# Patient Record
Sex: Male | Born: 1950 | Race: White | Hispanic: No | Marital: Married
Health system: Southern US, Community
[De-identification: ages and names within clinical notes are randomized; demographics above are authoritative.]

---

## 2007-06-10 ENCOUNTER — Ambulatory Visit: Payer: Self-pay | Admitting: Internal Medicine

## 2007-06-10 DIAGNOSIS — E785 Hyperlipidemia, unspecified: Secondary | ICD-10-CM | POA: Insufficient documentation

## 2007-06-10 DIAGNOSIS — N410 Acute prostatitis: Secondary | ICD-10-CM | POA: Insufficient documentation

## 2007-06-10 DIAGNOSIS — M549 Dorsalgia, unspecified: Secondary | ICD-10-CM | POA: Insufficient documentation

## 2007-06-10 DIAGNOSIS — N509 Disorder of male genital organs, unspecified: Secondary | ICD-10-CM | POA: Insufficient documentation

## 2007-06-10 DIAGNOSIS — I10 Essential (primary) hypertension: Secondary | ICD-10-CM | POA: Insufficient documentation

## 2007-06-10 LAB — CONVERTED CEMR LAB
ALT: 50 units/L (ref 0–53)
AST: 31 units/L (ref 0–37)
Albumin: 4.1 g/dL (ref 3.5–5.2)
Alkaline Phosphatase: 50 units/L (ref 39–117)
BUN: 11 mg/dL (ref 6–23)
Basophils Absolute: 0.1 10*3/uL (ref 0.0–0.1)
Basophils Relative: 0.6 % (ref 0.0–1.0)
Bilirubin Urine: NEGATIVE
Bilirubin, Direct: 0.2 mg/dL (ref 0.0–0.3)
Blood in Urine, dipstick: NEGATIVE
CO2: 28 meq/L (ref 19–32)
Calcium: 9.5 mg/dL (ref 8.4–10.5)
Chloride: 105 meq/L (ref 96–112)
Cholesterol: 126 mg/dL (ref 0–200)
Creatinine, Ser: 1 mg/dL (ref 0.4–1.5)
Eosinophils Absolute: 0.4 10*3/uL (ref 0.0–0.6)
Eosinophils Relative: 3.7 % (ref 0.0–5.0)
GFR calc Af Amer: 101 mL/min
GFR calc non Af Amer: 83 mL/min
Glucose, Bld: 100 mg/dL — ABNORMAL HIGH (ref 70–99)
Glucose, Urine, Semiquant: NEGATIVE
HCT: 42.5 % (ref 39.0–52.0)
HDL: 29.5 mg/dL — ABNORMAL LOW (ref >39.0)
Hemoglobin: 15.1 g/dL (ref 13.0–17.0)
Ketones, urine, test strip: NEGATIVE
LDL Cholesterol: 72 mg/dL (ref 0–99)
Lymphocytes Relative: 19.4 % (ref 12.0–46.0)
MCHC: 35.6 g/dL (ref 30.0–36.0)
MCV: 95.7 fL (ref 78.0–100.0)
Monocytes Absolute: 0.8 10*3/uL — ABNORMAL HIGH (ref 0.2–0.7)
Monocytes Relative: 8.1 % (ref 3.0–11.0)
Neutro Abs: 6.8 10*3/uL (ref 1.4–7.7)
Neutrophils Relative %: 68.2 % (ref 43.0–77.0)
Nitrite: NEGATIVE
Platelets: 281 10*3/uL (ref 150–400)
Potassium: 4.2 meq/L (ref 3.5–5.1)
Protein, U semiquant: NEGATIVE
RBC: 4.45 M/uL (ref 4.22–5.81)
RDW: 11.6 % (ref 11.5–14.6)
Sodium: 141 meq/L (ref 135–145)
Specific Gravity, Urine: 1.025
Total Bilirubin: 0.7 mg/dL (ref 0.3–1.2)
Total CHOL/HDL Ratio: 4.3
Total Protein: 6.6 g/dL (ref 6.0–8.3)
Triglycerides: 121 mg/dL (ref 0–149)
Urobilinogen, UA: 0.2
VLDL: 24 mg/dL (ref 0–40)
WBC Urine, dipstick: NEGATIVE
WBC: 10.1 10*3/uL (ref 4.5–10.5)
pH: 5.5

## 2016-04-17 ENCOUNTER — Ambulatory Visit: Payer: Self-pay | Admitting: Neurology

## 2022-02-19 ENCOUNTER — Emergency Department (HOSPITAL_BASED_OUTPATIENT_CLINIC_OR_DEPARTMENT_OTHER): Payer: 59

## 2022-02-19 ENCOUNTER — Other Ambulatory Visit: Payer: Self-pay

## 2022-02-19 ENCOUNTER — Emergency Department (HOSPITAL_BASED_OUTPATIENT_CLINIC_OR_DEPARTMENT_OTHER)
Admission: EM | Admit: 2022-02-19 | Discharge: 2022-02-19 | Disposition: A | Discharge status: Home / Self Care | Payer: 59 | Attending: Emergency Medicine | Admitting: Emergency Medicine

## 2022-02-19 DIAGNOSIS — R112 Nausea with vomiting, unspecified: Secondary | ICD-10-CM

## 2022-02-19 DIAGNOSIS — I10 Essential (primary) hypertension: Secondary | ICD-10-CM | POA: Diagnosis not present

## 2022-02-19 DIAGNOSIS — E860 Dehydration: Secondary | ICD-10-CM | POA: Insufficient documentation

## 2022-02-19 DIAGNOSIS — M791 Myalgia, unspecified site: Secondary | ICD-10-CM | POA: Insufficient documentation

## 2022-02-19 DIAGNOSIS — R6883 Chills (without fever): Secondary | ICD-10-CM | POA: Insufficient documentation

## 2022-02-19 LAB — URINALYSIS, ROUTINE W REFLEX MICROSCOPIC
Bilirubin Urine: NEGATIVE
Glucose, UA: NEGATIVE mg/dL
Hgb urine dipstick: NEGATIVE
Ketones, ur: NEGATIVE mg/dL
Leukocytes,Ua: NEGATIVE
Nitrite: NEGATIVE
Protein, ur: NEGATIVE mg/dL
Specific Gravity, Urine: 1.01 (ref 1.005–1.030)
pH: 6 (ref 5.0–8.0)

## 2022-02-19 LAB — BASIC METABOLIC PANEL
Anion gap: 8 (ref 5–15)
BUN: 13 mg/dL (ref 8–23)
CO2: 28 mmol/L (ref 22–32)
Calcium: 8.7 mg/dL — ABNORMAL LOW (ref 8.9–10.3)
Chloride: 100 mmol/L (ref 98–111)
Creatinine, Ser: 1.03 mg/dL (ref 0.61–1.24)
GFR, Estimated: >60 mL/min (ref >60)
Glucose, Bld: 113 mg/dL — ABNORMAL HIGH (ref 70–99)
Potassium: 4.4 mmol/L (ref 3.5–5.1)
Sodium: 136 mmol/L (ref 135–145)

## 2022-02-19 LAB — CBC
HCT: 46.1 % (ref 39.0–52.0)
Hemoglobin: 16.1 g/dL (ref 13.0–17.0)
MCH: 31.3 pg (ref 26.0–34.0)
MCHC: 34.9 g/dL (ref 30.0–36.0)
MCV: 89.5 fL (ref 80.0–100.0)
Platelets: 317 10*3/uL (ref 150–400)
RBC: 5.15 MIL/uL (ref 4.22–5.81)
RDW: 13.5 % (ref 11.5–15.5)
WBC: 11.2 10*3/uL — ABNORMAL HIGH (ref 4.0–10.5)
nRBC: 0 % (ref 0.0–0.2)

## 2022-02-19 LAB — HEPATIC FUNCTION PANEL
ALT: 55 U/L — ABNORMAL HIGH (ref 0–44)
AST: 29 U/L (ref 15–41)
Albumin: 3.5 g/dL (ref 3.5–5.0)
Alkaline Phosphatase: 42 U/L (ref 38–126)
Bilirubin, Direct: 0.2 mg/dL (ref 0.0–0.2)
Indirect Bilirubin: 0.5 mg/dL (ref 0.3–0.9)
Total Bilirubin: 0.7 mg/dL (ref 0.3–1.2)
Total Protein: 6.6 g/dL (ref 6.5–8.1)

## 2022-02-19 LAB — LIPASE, BLOOD: Lipase: 36 U/L (ref 11–51)

## 2022-02-19 LAB — CBG MONITORING, ED: Glucose-Capillary: 117 mg/dL — ABNORMAL HIGH (ref 70–99)

## 2022-02-19 LAB — LACTIC ACID, PLASMA: Lactic Acid, Venous: 0.9 mmol/L (ref 0.5–1.9)

## 2022-02-19 MED ORDER — IOHEXOL 300 MG/ML  SOLN
125.0000 mL | Freq: Once | INTRAMUSCULAR | Status: AC | PRN
  Administered 2022-02-19: 125 mL via INTRAVENOUS

## 2022-02-19 MED ORDER — PROMETHAZINE HCL 25 MG PO TABS: 25.0000 mg | ORAL_TABLET | Freq: Three times a day (TID) | ORAL | 0 refills | Status: AC | PRN

## 2022-02-19 MED ORDER — ONDANSETRON HCL 4 MG/2ML IJ SOLN
4.0000 mg | Freq: Once | INTRAMUSCULAR | Status: AC
  Administered 2022-02-19: 4 mg via INTRAVENOUS
  Filled 2022-02-19: qty 2

## 2022-02-19 MED ORDER — SODIUM CHLORIDE 0.9 % IV BOLUS
1000.0000 mL | Freq: Once | INTRAVENOUS | Status: AC
  Administered 2022-02-19: 1000 mL via INTRAVENOUS

## 2022-02-19 NOTE — ED Triage Notes (Signed)
C/O nausea, vomiting and fatigue x 1 week; reported was seen at [facility] and was prescribed some zofran but symptoms are persistent;

## 2022-02-19 NOTE — ED Provider Notes (Signed)
[facility] EMERGENCY DEPARTMENT Provider Note   CSN: [id number] Arrival date & time: 02/19/22  1518     History  Chief Complaint  Patient presents with   Nausea   Fatigue    Jermaine Baker is a 71 y.o. male.  The history is provided by the patient and medical records. No language interpreter was used.  Emesis Severity:  Moderate Duration:  1 week Timing:  Intermittent Quality:  Stomach contents Progression:  Unchanged Chronicity:  New Recent urination:  Decreased Relieved by:  Nothing Worsened by:  Nothing Ineffective treatments:  None tried Associated symptoms: abdominal pain, chills and myalgias   Associated symptoms: no cough, no diarrhea, no fever, no headaches and no URI   Abdominal pain:    Location:  Generalized   Quality: aching and cramping     Severity:  Severe   Onset quality:  Gradual   Duration:  1 week   Timing:  Intermittent   Progression:  Waxing and waning   Chronicity:  New Risk factors: prior abdominal surgery        Home Medications Prior to Admission medications   Not on File      Allergies    Propoxyphene n-acetaminophen and Rifampin    Review of Systems   Review of Systems  Constitutional:  Positive for chills and fatigue. Negative for diaphoresis and fever.  HENT:  Negative for congestion.   Respiratory:  Negative for cough, chest tightness, shortness of breath and wheezing.   Cardiovascular:  Negative for chest pain, palpitations and leg swelling.  Gastrointestinal:  Positive for abdominal pain, constipation, nausea and vomiting. Negative for abdominal distention, anal bleeding and diarrhea.  Genitourinary:  Positive for decreased urine volume. Negative for dysuria, flank pain and frequency.  Musculoskeletal:  Positive for myalgias. Negative for back pain, neck pain and neck stiffness.  Skin:  Negative for rash and wound.  Neurological:  Negative for dizziness, weakness, light-headedness, numbness and headaches.   Psychiatric/Behavioral:  Negative for agitation.   All other systems reviewed and are negative.   Physical Exam Updated Vital Signs BP (!) 144/78   Pulse 69   Temp 99.2 F (37.3 C) (Oral)   Resp 16   Ht 6' (1.829 m)   Wt 122.5 kg   SpO2 93%   BMI 36.62 kg/m  Physical Exam Vitals and nursing note reviewed.  Constitutional:      General: He is not in acute distress.    Appearance: He is well-developed. He is not ill-appearing, toxic-appearing or diaphoretic.  HENT:     Head: Normocephalic and atraumatic.     Mouth/Throat:     Mouth: Mucous membranes are dry.  Eyes:     Extraocular Movements: Extraocular movements intact.     Conjunctiva/sclera: Conjunctivae normal.     Pupils: Pupils are equal, round, and reactive to light.  Cardiovascular:     Rate and Rhythm: Normal rate and regular rhythm.     Heart sounds: No murmur heard. Pulmonary:     Effort: Pulmonary effort is normal. No respiratory distress.     Breath sounds: Normal breath sounds. No wheezing, rhonchi or rales.  Chest:     Chest wall: No tenderness.  Abdominal:     General: Abdomen is flat.     Palpations: Abdomen is soft.     Tenderness: There is abdominal tenderness. There is no right CVA tenderness, left CVA tenderness, guarding or rebound.  Musculoskeletal:        General: No swelling or  tenderness.     Cervical back: Neck supple.     Right lower leg: No edema.     Left lower leg: No edema.  Skin:    General: Skin is warm and dry.     Capillary Refill: Capillary refill takes less than 2 seconds.     Findings: No erythema.  Neurological:     General: No focal deficit present.     Mental Status: He is alert.     Sensory: No sensory deficit.     Motor: No weakness.  Psychiatric:        Mood and Affect: Mood normal.     ED Results / Procedures / Treatments   Labs (all labs ordered are listed, but only abnormal results are displayed) Labs Reviewed  BASIC METABOLIC PANEL - Abnormal; Notable for  the following components:      Result Value   Glucose, Bld 113 (*)    Calcium 8.7 (*)    All other components within normal limits  CBC - Abnormal; Notable for the following components:   WBC 11.2 (*)    All other components within normal limits  HEPATIC FUNCTION PANEL - Abnormal; Notable for the following components:   ALT 55 (*)    All other components within normal limits  CBG MONITORING, ED - Abnormal; Notable for the following components:   Glucose-Capillary 117 (*)    All other components within normal limits  URINE CULTURE  URINALYSIS, ROUTINE W REFLEX MICROSCOPIC  LIPASE, BLOOD  LACTIC ACID, PLASMA    EKG EKG Interpretation  Date/Time:  Thursday February 19 2022 15:35:30 EDT Ventricular Rate:  94 PR Interval:    QRS Duration: 88 QT Interval:  370 QTC Calculation: 462 R Axis:   -7 Text Interpretation: Atrial flutter with variable A-V block Possible Anterior infarct , age undetermined Abnormal ECG No previous ECGs available no prior ecg for comparison. NO STEMI Confirmed by Theda Belfast ([id number]) on 02/19/2022 7:05:13 PM  Radiology CT ABDOMEN PELVIS W CONTRAST  Result Date: 02/19/2022 CLINICAL DATA:  Abdominal pain, nausea, vomiting, prior abdominal surgery, question bowel obstruction EXAM: CT ABDOMEN AND PELVIS WITH CONTRAST TECHNIQUE: Multidetector CT imaging of the abdomen and pelvis was performed using the standard protocol following bolus administration of intravenous contrast. RADIATION DOSE REDUCTION: This exam was performed according to the departmental dose-optimization program which includes automated exposure control, adjustment of the mA and/or kV according to patient size and/or use of iterative reconstruction technique. CONTRAST:  OMNIPAQUE IOHEXOL 300 MG/ML  SOLN COMPARISON:  None available FINDINGS: Lower chest: Minimal bibasilar atelectasis Hepatobiliary: Mildly send is gallbladder. Gallbladder and liver otherwise normal appearance. No biliary dilatation.  Pancreas: Normal appearance Spleen: Normal appearance Adrenals/Urinary Tract: Adrenal glands and RIGHT kidney normal appearance. LEFT renal cyst 2.4 x 2.2 cm containing dependent milk of calcium. Large cyst at upper pole LEFT kidney 7.5 x 6.5 cm. No additional renal mass or urinary tract calcification. Ureters and bladder unremarkable. Stomach/Bowel: Normal appendix. Diverticulosis of distal half of colon without evidence of diverticulitis. Proximal colon normal appearance. Stomach and small bowel loops normal appearance. No evidence of bowel obstruction or wall thickening. Vascular/Lymphatic: Atherosclerotic calcifications aorta and iliac arteries without aneurysm. No adenopathy. Scattered normal sized retroperitoneal and mesenteric lymph nodes. Reproductive: Mild prostatic enlargement. Seminal vesicles unremarkable. Other: Small BILATERAL inguinal hernias containing fat. No free air or free fluid. No hernia or inflammatory process. Musculoskeletal: Osseous structures unremarkable. IMPRESSION: Distal colonic diverticulosis without evidence of diverticulitis. Small BILATERAL inguinal hernias containing  fat. No acute intra-abdominal or intrapelvic abnormalities. Mild prostatic enlargement. Aortic Atherosclerosis (ICD10-I70.0). Electronically Signed   By: Ulyses Southward M.D.   On: 02/19/2022 19:46    Procedures Procedures    Medications Ordered in ED Medications  sodium chloride 0.9 % bolus 1,000 mL (0 mLs Intravenous Stopped 02/19/22 2025)  ondansetron (ZOFRAN) injection 4 mg (4 mg Intravenous Given 02/19/22 1827)  iohexol (OMNIPAQUE) 300 MG/ML solution 125 mL (125 mLs Intravenous Contrast Given 02/19/22 1917)     ED Course/ Medical Decision Making/ A&P                           Medical Decision Making Amount and/or Complexity of Data Reviewed Labs: ordered. Radiology: ordered.  Risk Prescription drug management.    Jermaine Baker is a 71 y.o. male with a past medical history significant for  hypertension, hyperlipidemia, and previous abdominal hernia surgery in the past who presents with nausea, vomiting, fatigue, chills, and abdominal discomfort.  According to patient over the last week has had waxing waning symptoms of abdominal cramping/discomfort and nausea and vomiting.  He reports that he was given a prescription for Zofran that is intermittently helped however he still has the abdominal discomfort, and going.  He reports he has continued to pass some gas but is not having a bowel movement in the last 5 days.  This is atypical for him.  He reports his urine has been decreased.  He thinks he is getting dehydrated.  He denies any documented fevers at home but has had some chills and temp was 99 on arrival.  He reports no chest pain, palpitations, shortness of breath, or cough.  Denies any trauma.  Does not report any other complaints such as rashes or medication changes.  On exam, abdomen is tender in the central abdomen.  Bowel sounds were appreciated.  Flanks and back nontender.  He denies any groin symptoms at this time.  Legs nontender and nonedematous.  Mucous membranes are very dry on exam.  Lungs are clear and chest is nontender.  No murmur.  Clinically I do feel need to rule out a bowel obstruction given the patient's abdominal discomfort up to 9 out of 10 in severity at times, nausea, vomiting, and no bowel movement in 5 days with history of previous abdominal surgery.  We will get CT and get labs.  We will check urinalysis given his decreased urination.  We will give him some fluids.  Clinically I also suspect he is probably dehydrated from either a gastroenteritis that worsened his constipation and abdominal discomfort.  Anticipate reassessment after work-up to determine disposition.  We will give some fluids for his dry mouth and will also give some more IV nausea medicine.  Patient felt much better after fluids and nausea medicine.  Work-up overall reassuring.  CT scan showed  diverticulosis but no diverticulitis.  No critical abnormality seen.  Suspect likely a viral gastroenteritis and we will give him prescription for different nausea medicine to try for Zofran at home does not work.  He agrees with PCP follow-up and had no other questions or concerns.  Patient discharged in good condition.         Final Clinical Impression(s) / ED Diagnoses Final diagnoses:  Dehydration  Nausea and vomiting, unspecified vomiting type    Rx / DC Orders ED Discharge Orders          Ordered    promethazine (PHENERGAN) 25 MG tablet  Every 8 hours PRN        02/19/22 2213            Clinical Impression: 1. Dehydration   2. Nausea and vomiting, unspecified vomiting type     Disposition: Discharge  Condition: Good  I have discussed the results, Dx and Tx plan with the pt(& family if present). He/she/they expressed understanding and agree(s) with the plan. Discharge instructions discussed at great length. Strict return precautions discussed and pt &/or family have verbalized understanding of the instructions. No further questions at time of discharge.    New Prescriptions   PROMETHAZINE (PHENERGAN) 25 MG TABLET    Take 1 tablet (25 mg total) by mouth every 8 (eight) hours as needed for nausea or vomiting.    Follow Up: Ranae Pila, FNP [address] [place] [address] [phone]         Malene Blaydes, Canary Brim, MD 02/19/22 2214

## 2022-02-19 NOTE — Discharge Instructions (Signed)
Your history, exam and work-up today are consistent with likely dehydration related to a viral gastroenteritis.  The CT scan did not show any acute abnormalities and your labs were overall reassuring.  We feel you are safe for discharge home.  Please rest and stay hydrated and follow-up with your primary team.  If any symptoms change or worsen acutely, please return to the nearest emergency department.

## 2022-02-21 LAB — URINE CULTURE: Culture: NO GROWTH

## 2023-07-16 IMAGING — CT CT ABD-PELV W/ CM
2 of 5 series · 16 of 46 positions shown, 18 images · IV contrast (Omnipaque)
Comparison: None available

CLINICAL DATA: Abdominal pain, nausea, vomiting, prior abdominal
surgery, question bowel obstruction

EXAM:
CT ABDOMEN AND PELVIS WITH CONTRAST
TECHNIQUE: Multidetector CT imaging of the abdomen and pelvis was performed
using the standard protocol following bolus administration of
intravenous contrast.

[Series 2: axial st · axial · 0.98mm/px · z∈[+674,+1139]mm · 13 of 105 slices shown, 15 images]
[im 6/105  soft-tissue]
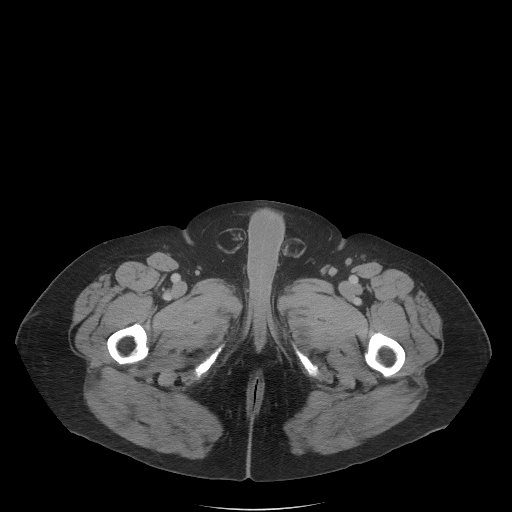
[im 6/105  bone]
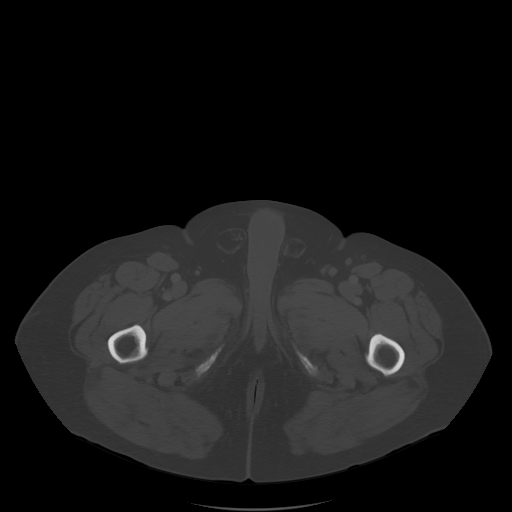
[im 17/105  soft-tissue]
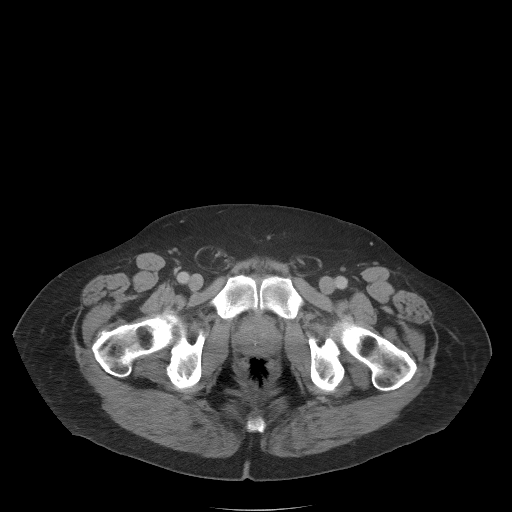
[im 22/105  soft-tissue]
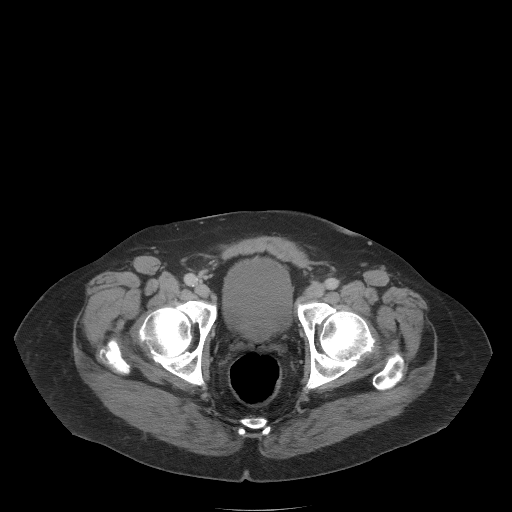
[im 28/105  soft-tissue]
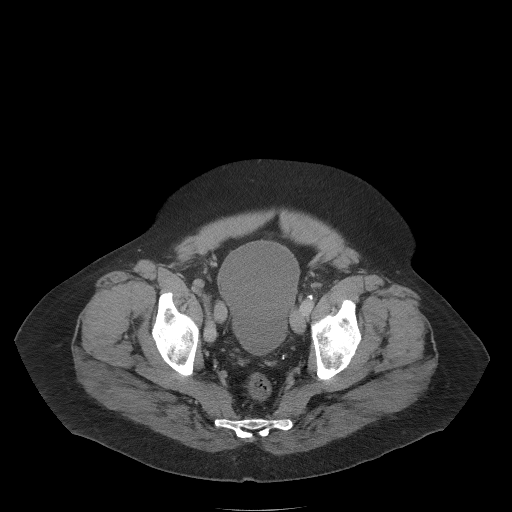
[im 39/105  soft-tissue]
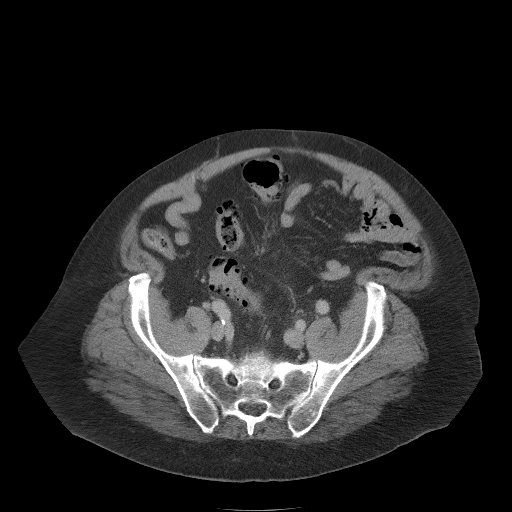
[im 44/105  soft-tissue]
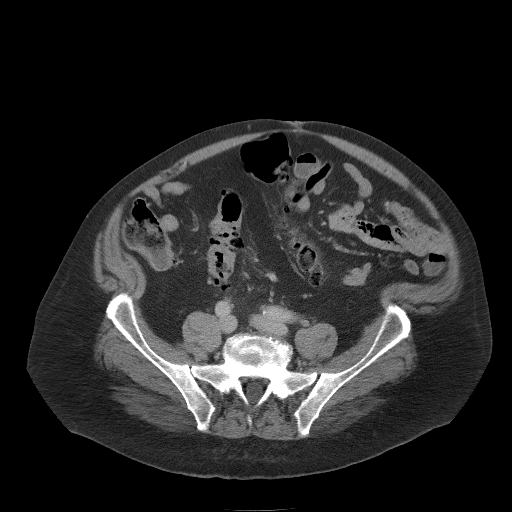
[im 55/105  soft-tissue]
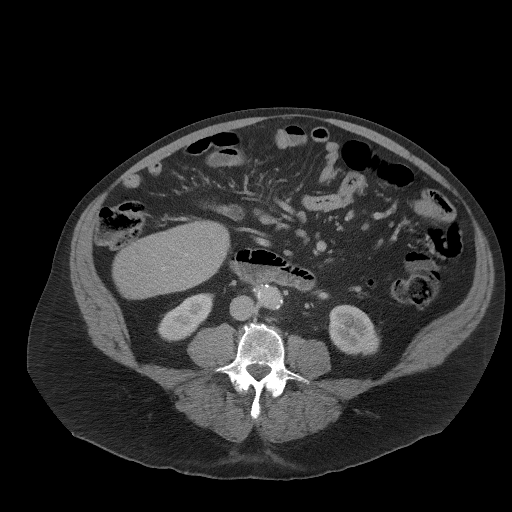
[im 61/105  soft-tissue]
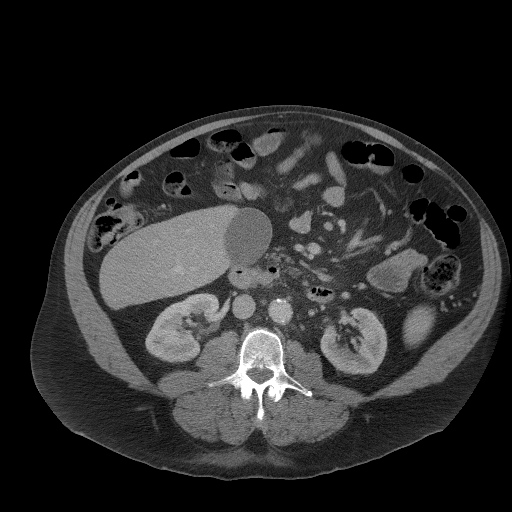
[im 66/105  soft-tissue]
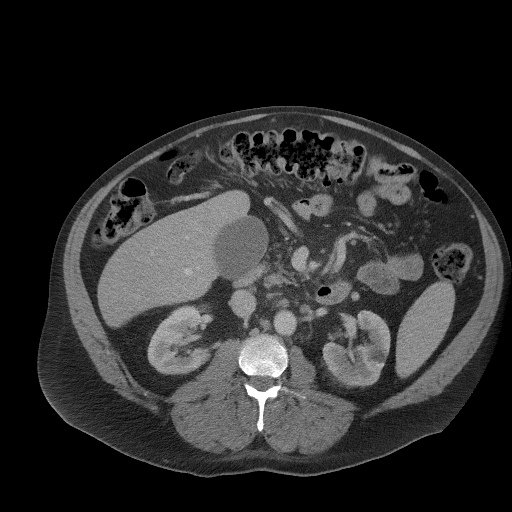
[im 66/105  bone]
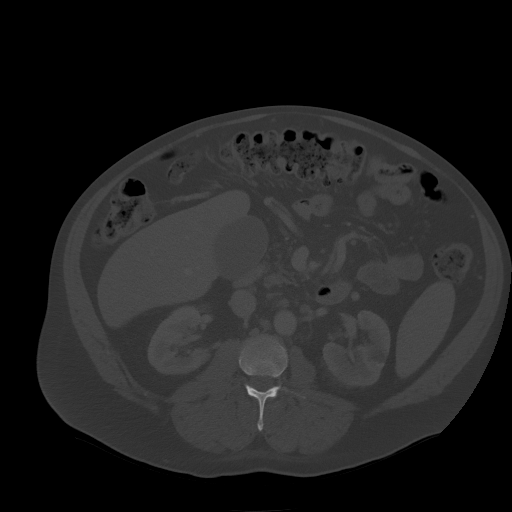
[im 77/105  soft-tissue]
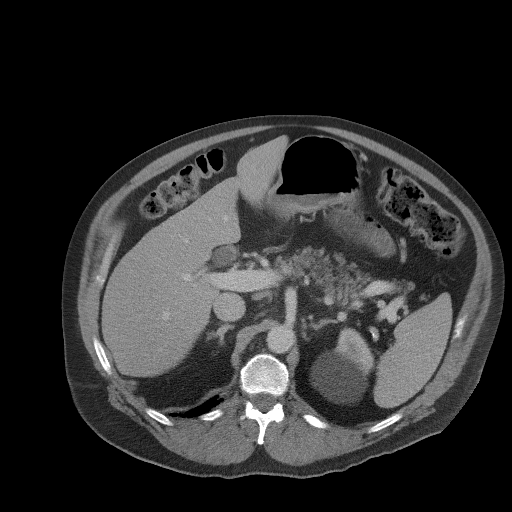
[im 83/105  soft-tissue]
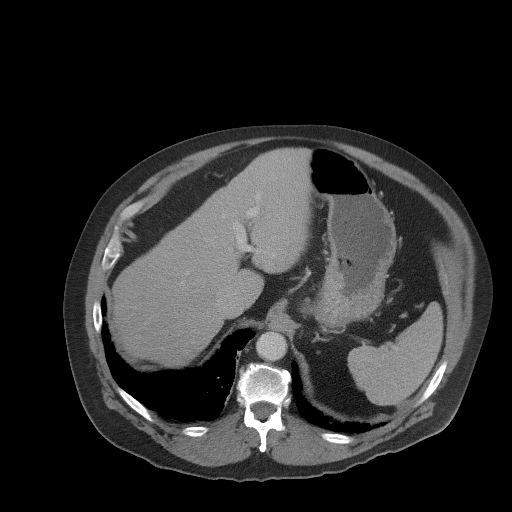
[im 88/105  soft-tissue]
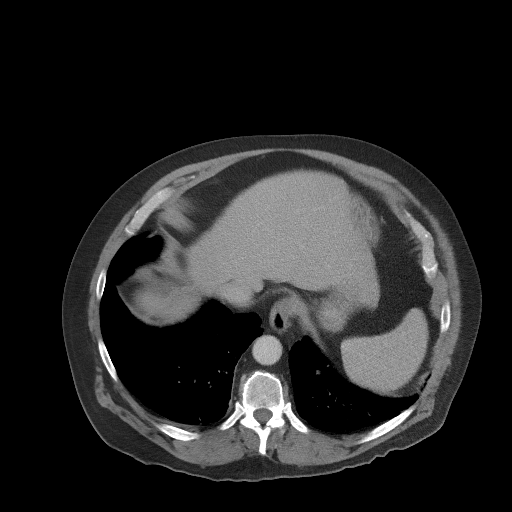
[im 99/105  soft-tissue]
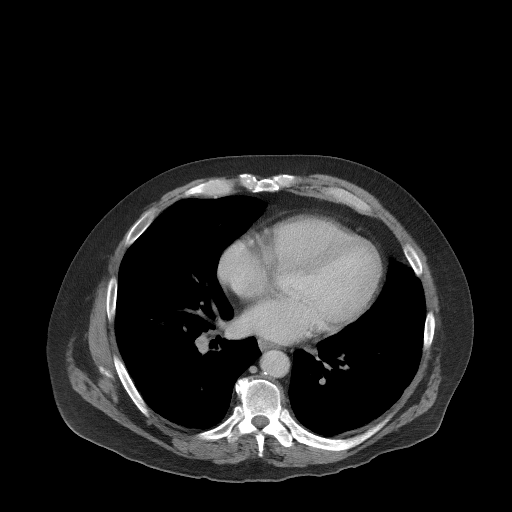

[Series 5: coronal st · coronal · 0.97mm/px · 3 of 124 slices shown]
[im 42/124  soft-tissue]
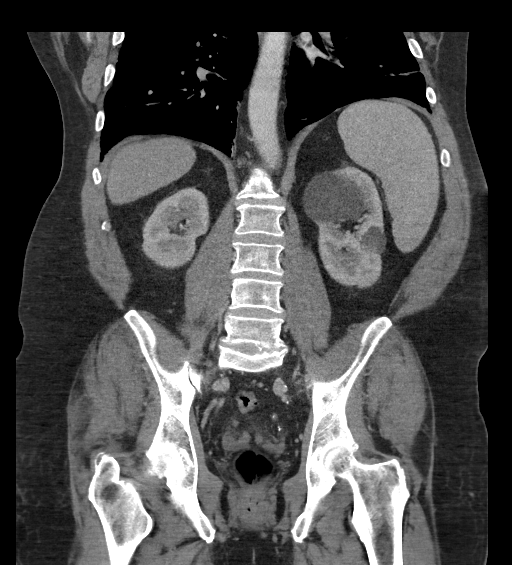
[im 55/124  soft-tissue]
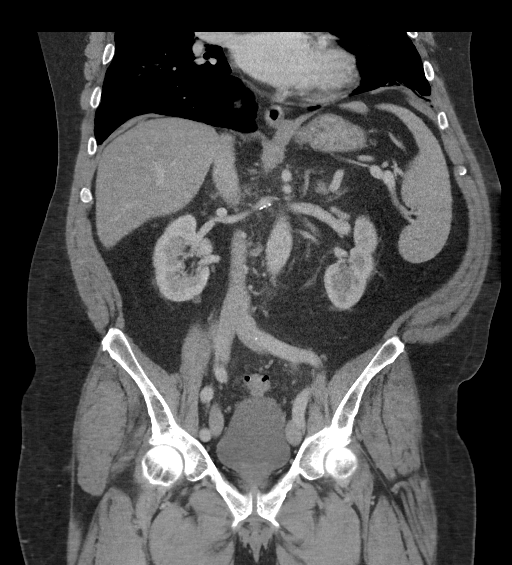
[im 69/124  soft-tissue]
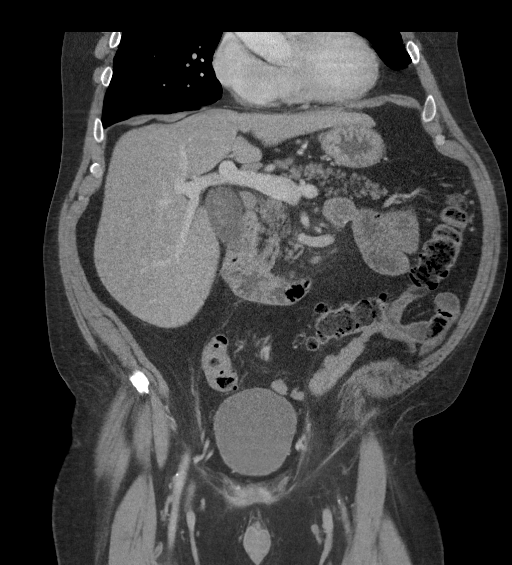

[16 of 46 positions shown; findings below may reference images not displayed]

RADIATION DOSE REDUCTION: This exam was performed according to the
departmental dose-optimization program which includes automated
exposure control, adjustment of the mA and/or kV according to
patient size and/or use of iterative reconstruction technique.

CONTRAST:  125mL OMNIPAQUE IOHEXOL 300 MG/ML  SOLN
FINDINGS: Lower chest: Minimal bibasilar atelectasis

Hepatobiliary: Mildly send is gallbladder. Gallbladder and liver
otherwise normal appearance. No biliary dilatation.

Pancreas: Normal appearance

Spleen: Normal appearance

Adrenals/Urinary Tract: Adrenal glands and RIGHT kidney normal
appearance. LEFT renal cyst 2.4 x 2.2 cm containing dependent milk
of calcium. Large cyst at upper pole LEFT kidney 7.5 x 6.5 cm. No
additional renal mass or urinary tract calcification. Ureters and
bladder unremarkable.

Stomach/Bowel: Normal appendix. Diverticulosis of distal half of
colon without evidence of diverticulitis. Proximal colon normal
appearance. Stomach and small bowel loops normal appearance. No
evidence of bowel obstruction or wall thickening.

Vascular/Lymphatic: Atherosclerotic calcifications aorta and iliac
arteries without aneurysm. No adenopathy. Scattered normal sized
retroperitoneal and mesenteric lymph nodes.

Reproductive: Mild prostatic enlargement. Seminal vesicles
unremarkable.

Other: Small BILATERAL inguinal hernias containing fat. No free air
or free fluid. No hernia or inflammatory process.

Musculoskeletal: Osseous structures unremarkable.
IMPRESSION: Distal colonic diverticulosis without evidence of diverticulitis.

Small BILATERAL inguinal hernias containing fat.

No acute intra-abdominal or intrapelvic abnormalities.

Mild prostatic enlargement.

Aortic Atherosclerosis ([id number]).

## 2024-02-16 DIAGNOSIS — K080 Exfoliation of teeth due to systemic causes: Secondary | ICD-10-CM | POA: Diagnosis not present

## 2024-02-25 DIAGNOSIS — F411 Generalized anxiety disorder: Secondary | ICD-10-CM | POA: Diagnosis not present

## 2024-02-25 DIAGNOSIS — K5903 Drug induced constipation: Secondary | ICD-10-CM | POA: Diagnosis not present

## 2024-02-25 DIAGNOSIS — F1120 Opioid dependence, uncomplicated: Secondary | ICD-10-CM | POA: Diagnosis not present

## 2024-02-28 DIAGNOSIS — I878 Other specified disorders of veins: Secondary | ICD-10-CM | POA: Diagnosis not present

## 2024-02-28 DIAGNOSIS — E785 Hyperlipidemia, unspecified: Secondary | ICD-10-CM | POA: Diagnosis not present

## 2024-02-28 DIAGNOSIS — I4892 Unspecified atrial flutter: Secondary | ICD-10-CM | POA: Diagnosis not present

## 2024-02-28 DIAGNOSIS — E538 Deficiency of other specified B group vitamins: Secondary | ICD-10-CM | POA: Diagnosis not present

## 2024-02-28 DIAGNOSIS — G894 Chronic pain syndrome: Secondary | ICD-10-CM | POA: Diagnosis not present

## 2024-02-28 DIAGNOSIS — R0902 Hypoxemia: Secondary | ICD-10-CM | POA: Diagnosis not present

## 2024-02-28 DIAGNOSIS — J441 Chronic obstructive pulmonary disease with (acute) exacerbation: Secondary | ICD-10-CM | POA: Diagnosis not present

## 2024-02-28 DIAGNOSIS — Z87891 Personal history of nicotine dependence: Secondary | ICD-10-CM | POA: Diagnosis not present

## 2024-02-28 DIAGNOSIS — I351 Nonrheumatic aortic (valve) insufficiency: Secondary | ICD-10-CM | POA: Diagnosis not present

## 2024-02-28 DIAGNOSIS — E559 Vitamin D deficiency, unspecified: Secondary | ICD-10-CM | POA: Diagnosis not present

## 2024-02-28 DIAGNOSIS — R0602 Shortness of breath: Secondary | ICD-10-CM | POA: Diagnosis not present

## 2024-02-28 DIAGNOSIS — E669 Obesity, unspecified: Secondary | ICD-10-CM | POA: Diagnosis not present

## 2024-02-28 DIAGNOSIS — I10 Essential (primary) hypertension: Secondary | ICD-10-CM | POA: Diagnosis not present

## 2024-02-28 DIAGNOSIS — F419 Anxiety disorder, unspecified: Secondary | ICD-10-CM | POA: Diagnosis not present

## 2024-02-28 DIAGNOSIS — N401 Enlarged prostate with lower urinary tract symptoms: Secondary | ICD-10-CM | POA: Diagnosis not present

## 2024-02-28 DIAGNOSIS — I2489 Other forms of acute ischemic heart disease: Secondary | ICD-10-CM | POA: Diagnosis not present

## 2024-02-28 DIAGNOSIS — Z7951 Long term (current) use of inhaled steroids: Secondary | ICD-10-CM | POA: Diagnosis not present

## 2024-02-28 DIAGNOSIS — R0689 Other abnormalities of breathing: Secondary | ICD-10-CM | POA: Diagnosis not present

## 2024-02-28 DIAGNOSIS — K219 Gastro-esophageal reflux disease without esophagitis: Secondary | ICD-10-CM | POA: Diagnosis not present

## 2024-02-28 DIAGNOSIS — F22 Delusional disorders: Secondary | ICD-10-CM | POA: Diagnosis not present

## 2024-02-28 DIAGNOSIS — Z6838 Body mass index (BMI) 38.0-38.9, adult: Secondary | ICD-10-CM | POA: Diagnosis not present

## 2024-02-28 DIAGNOSIS — D72829 Elevated white blood cell count, unspecified: Secondary | ICD-10-CM | POA: Diagnosis not present

## 2024-02-28 DIAGNOSIS — F909 Attention-deficit hyperactivity disorder, unspecified type: Secondary | ICD-10-CM | POA: Diagnosis not present

## 2024-02-28 DIAGNOSIS — E119 Type 2 diabetes mellitus without complications: Secondary | ICD-10-CM | POA: Diagnosis not present

## 2024-02-28 DIAGNOSIS — Z79899 Other long term (current) drug therapy: Secondary | ICD-10-CM | POA: Diagnosis not present

## 2024-02-28 DIAGNOSIS — R7989 Other specified abnormal findings of blood chemistry: Secondary | ICD-10-CM | POA: Diagnosis not present

## 2024-03-10 DIAGNOSIS — J189 Pneumonia, unspecified organism: Secondary | ICD-10-CM | POA: Diagnosis not present

## 2024-03-10 DIAGNOSIS — E669 Obesity, unspecified: Secondary | ICD-10-CM | POA: Diagnosis not present

## 2024-03-10 DIAGNOSIS — J449 Chronic obstructive pulmonary disease, unspecified: Secondary | ICD-10-CM | POA: Diagnosis not present

## 2024-03-10 DIAGNOSIS — R0902 Hypoxemia: Secondary | ICD-10-CM | POA: Diagnosis not present

## 2024-03-24 DIAGNOSIS — F411 Generalized anxiety disorder: Secondary | ICD-10-CM | POA: Diagnosis not present

## 2024-03-24 DIAGNOSIS — F1120 Opioid dependence, uncomplicated: Secondary | ICD-10-CM | POA: Diagnosis not present

## 2024-03-24 DIAGNOSIS — Z79899 Other long term (current) drug therapy: Secondary | ICD-10-CM | POA: Diagnosis not present

## 2024-03-24 DIAGNOSIS — K5903 Drug induced constipation: Secondary | ICD-10-CM | POA: Diagnosis not present

## 2024-03-24 DIAGNOSIS — D692 Other nonthrombocytopenic purpura: Secondary | ICD-10-CM | POA: Diagnosis not present

## 2024-03-28 DIAGNOSIS — K080 Exfoliation of teeth due to systemic causes: Secondary | ICD-10-CM | POA: Diagnosis not present

## 2024-04-02 DIAGNOSIS — N400 Enlarged prostate without lower urinary tract symptoms: Secondary | ICD-10-CM | POA: Diagnosis not present

## 2024-04-02 DIAGNOSIS — Z7951 Long term (current) use of inhaled steroids: Secondary | ICD-10-CM | POA: Diagnosis not present

## 2024-04-02 DIAGNOSIS — J440 Chronic obstructive pulmonary disease with acute lower respiratory infection: Secondary | ICD-10-CM | POA: Diagnosis not present

## 2024-04-02 DIAGNOSIS — I700 Atherosclerosis of aorta: Secondary | ICD-10-CM | POA: Diagnosis not present

## 2024-04-02 DIAGNOSIS — I2510 Atherosclerotic heart disease of native coronary artery without angina pectoris: Secondary | ICD-10-CM | POA: Diagnosis not present

## 2024-04-02 DIAGNOSIS — I872 Venous insufficiency (chronic) (peripheral): Secondary | ICD-10-CM | POA: Diagnosis not present

## 2024-04-02 DIAGNOSIS — I2699 Other pulmonary embolism without acute cor pulmonale: Secondary | ICD-10-CM | POA: Diagnosis not present

## 2024-04-02 DIAGNOSIS — Z87891 Personal history of nicotine dependence: Secondary | ICD-10-CM | POA: Diagnosis not present

## 2024-04-02 DIAGNOSIS — I119 Hypertensive heart disease without heart failure: Secondary | ICD-10-CM | POA: Diagnosis not present

## 2024-04-02 DIAGNOSIS — F411 Generalized anxiety disorder: Secondary | ICD-10-CM | POA: Diagnosis not present

## 2024-04-02 DIAGNOSIS — J168 Pneumonia due to other specified infectious organisms: Secondary | ICD-10-CM | POA: Diagnosis not present

## 2024-04-02 DIAGNOSIS — R918 Other nonspecific abnormal finding of lung field: Secondary | ICD-10-CM | POA: Diagnosis not present

## 2024-04-02 DIAGNOSIS — R0602 Shortness of breath: Secondary | ICD-10-CM | POA: Diagnosis not present

## 2024-04-02 DIAGNOSIS — Z79899 Other long term (current) drug therapy: Secondary | ICD-10-CM | POA: Diagnosis not present

## 2024-04-02 DIAGNOSIS — E785 Hyperlipidemia, unspecified: Secondary | ICD-10-CM | POA: Diagnosis not present

## 2024-04-02 DIAGNOSIS — J90 Pleural effusion, not elsewhere classified: Secondary | ICD-10-CM | POA: Diagnosis not present

## 2024-04-02 DIAGNOSIS — R079 Chest pain, unspecified: Secondary | ICD-10-CM | POA: Diagnosis not present

## 2024-04-02 DIAGNOSIS — I080 Rheumatic disorders of both mitral and aortic valves: Secondary | ICD-10-CM | POA: Diagnosis not present

## 2024-04-02 DIAGNOSIS — J189 Pneumonia, unspecified organism: Secondary | ICD-10-CM | POA: Diagnosis not present

## 2024-04-02 DIAGNOSIS — F451 Undifferentiated somatoform disorder: Secondary | ICD-10-CM | POA: Diagnosis not present

## 2024-04-02 DIAGNOSIS — Z6838 Body mass index (BMI) 38.0-38.9, adult: Secondary | ICD-10-CM | POA: Diagnosis not present

## 2024-04-10 DIAGNOSIS — F22 Delusional disorders: Secondary | ICD-10-CM | POA: Diagnosis not present

## 2024-04-10 DIAGNOSIS — F411 Generalized anxiety disorder: Secondary | ICD-10-CM | POA: Diagnosis not present

## 2024-04-10 DIAGNOSIS — R5383 Other fatigue: Secondary | ICD-10-CM | POA: Diagnosis not present

## 2024-04-14 DIAGNOSIS — J189 Pneumonia, unspecified organism: Secondary | ICD-10-CM | POA: Diagnosis not present

## 2024-04-14 DIAGNOSIS — J449 Chronic obstructive pulmonary disease, unspecified: Secondary | ICD-10-CM | POA: Diagnosis not present

## 2024-04-14 DIAGNOSIS — I2699 Other pulmonary embolism without acute cor pulmonale: Secondary | ICD-10-CM | POA: Diagnosis not present

## 2024-04-14 DIAGNOSIS — Z9181 History of falling: Secondary | ICD-10-CM | POA: Diagnosis not present

## 2024-04-21 DIAGNOSIS — F1120 Opioid dependence, uncomplicated: Secondary | ICD-10-CM | POA: Diagnosis not present

## 2024-04-21 DIAGNOSIS — R918 Other nonspecific abnormal finding of lung field: Secondary | ICD-10-CM | POA: Diagnosis not present

## 2024-04-21 DIAGNOSIS — F411 Generalized anxiety disorder: Secondary | ICD-10-CM | POA: Diagnosis not present

## 2024-04-21 DIAGNOSIS — I2699 Other pulmonary embolism without acute cor pulmonale: Secondary | ICD-10-CM | POA: Diagnosis not present

## 2024-04-21 DIAGNOSIS — K5903 Drug induced constipation: Secondary | ICD-10-CM | POA: Diagnosis not present

## 2024-04-21 DIAGNOSIS — J449 Chronic obstructive pulmonary disease, unspecified: Secondary | ICD-10-CM | POA: Diagnosis not present

## 2024-04-21 DIAGNOSIS — J90 Pleural effusion, not elsewhere classified: Secondary | ICD-10-CM | POA: Diagnosis not present

## 2024-04-21 DIAGNOSIS — R0902 Hypoxemia: Secondary | ICD-10-CM | POA: Diagnosis not present

## 2024-04-24 DIAGNOSIS — Z87891 Personal history of nicotine dependence: Secondary | ICD-10-CM | POA: Diagnosis not present

## 2024-04-24 DIAGNOSIS — L818 Other specified disorders of pigmentation: Secondary | ICD-10-CM | POA: Diagnosis not present

## 2024-04-24 DIAGNOSIS — R5381 Other malaise: Secondary | ICD-10-CM | POA: Diagnosis not present

## 2024-04-24 DIAGNOSIS — L905 Scar conditions and fibrosis of skin: Secondary | ICD-10-CM | POA: Diagnosis not present

## 2024-04-24 DIAGNOSIS — R2689 Other abnormalities of gait and mobility: Secondary | ICD-10-CM | POA: Diagnosis not present

## 2024-04-24 DIAGNOSIS — I2699 Other pulmonary embolism without acute cor pulmonale: Secondary | ICD-10-CM | POA: Diagnosis not present

## 2024-04-24 DIAGNOSIS — R635 Abnormal weight gain: Secondary | ICD-10-CM | POA: Diagnosis not present

## 2024-04-24 DIAGNOSIS — L859 Epidermal thickening, unspecified: Secondary | ICD-10-CM | POA: Diagnosis not present

## 2024-04-24 DIAGNOSIS — I8290 Acute embolism and thrombosis of unspecified vein: Secondary | ICD-10-CM | POA: Diagnosis not present

## 2024-04-28 DIAGNOSIS — K5909 Other constipation: Secondary | ICD-10-CM | POA: Diagnosis not present

## 2024-04-28 DIAGNOSIS — N281 Cyst of kidney, acquired: Secondary | ICD-10-CM | POA: Diagnosis not present

## 2024-04-28 DIAGNOSIS — F22 Delusional disorders: Secondary | ICD-10-CM | POA: Diagnosis not present

## 2024-04-28 DIAGNOSIS — E538 Deficiency of other specified B group vitamins: Secondary | ICD-10-CM | POA: Diagnosis not present

## 2024-04-28 DIAGNOSIS — J440 Chronic obstructive pulmonary disease with acute lower respiratory infection: Secondary | ICD-10-CM | POA: Diagnosis not present

## 2024-04-28 DIAGNOSIS — Z86711 Personal history of pulmonary embolism: Secondary | ICD-10-CM | POA: Diagnosis not present

## 2024-04-28 DIAGNOSIS — Z9981 Dependence on supplemental oxygen: Secondary | ICD-10-CM | POA: Diagnosis not present

## 2024-04-28 DIAGNOSIS — Z87891 Personal history of nicotine dependence: Secondary | ICD-10-CM | POA: Diagnosis not present

## 2024-04-28 DIAGNOSIS — Z6838 Body mass index (BMI) 38.0-38.9, adult: Secondary | ICD-10-CM | POA: Diagnosis not present

## 2024-04-28 DIAGNOSIS — Z7901 Long term (current) use of anticoagulants: Secondary | ICD-10-CM | POA: Diagnosis not present

## 2024-04-28 DIAGNOSIS — G894 Chronic pain syndrome: Secondary | ICD-10-CM | POA: Diagnosis not present

## 2024-04-28 DIAGNOSIS — E66812 Obesity, class 2: Secondary | ICD-10-CM | POA: Diagnosis not present

## 2024-04-28 DIAGNOSIS — J90 Pleural effusion, not elsewhere classified: Secondary | ICD-10-CM | POA: Diagnosis not present

## 2024-04-28 DIAGNOSIS — I2699 Other pulmonary embolism without acute cor pulmonale: Secondary | ICD-10-CM | POA: Diagnosis not present

## 2024-04-28 DIAGNOSIS — J441 Chronic obstructive pulmonary disease with (acute) exacerbation: Secondary | ICD-10-CM | POA: Diagnosis not present

## 2024-04-28 DIAGNOSIS — J189 Pneumonia, unspecified organism: Secondary | ICD-10-CM | POA: Diagnosis not present

## 2024-04-28 DIAGNOSIS — R0689 Other abnormalities of breathing: Secondary | ICD-10-CM | POA: Diagnosis not present

## 2024-04-28 DIAGNOSIS — Z8701 Personal history of pneumonia (recurrent): Secondary | ICD-10-CM | POA: Diagnosis not present

## 2024-04-28 DIAGNOSIS — Z7409 Other reduced mobility: Secondary | ICD-10-CM | POA: Diagnosis not present

## 2024-04-28 DIAGNOSIS — Z4682 Encounter for fitting and adjustment of non-vascular catheter: Secondary | ICD-10-CM | POA: Diagnosis not present

## 2024-04-28 DIAGNOSIS — F1120 Opioid dependence, uncomplicated: Secondary | ICD-10-CM | POA: Diagnosis not present

## 2024-04-28 DIAGNOSIS — J918 Pleural effusion in other conditions classified elsewhere: Secondary | ICD-10-CM | POA: Diagnosis not present

## 2024-04-28 DIAGNOSIS — Z978 Presence of other specified devices: Secondary | ICD-10-CM | POA: Diagnosis not present

## 2024-04-28 DIAGNOSIS — J9811 Atelectasis: Secondary | ICD-10-CM | POA: Diagnosis not present

## 2024-04-28 DIAGNOSIS — R079 Chest pain, unspecified: Secondary | ICD-10-CM | POA: Diagnosis not present

## 2024-05-16 DIAGNOSIS — M6281 Muscle weakness (generalized): Secondary | ICD-10-CM | POA: Diagnosis not present

## 2024-05-19 DIAGNOSIS — D692 Other nonthrombocytopenic purpura: Secondary | ICD-10-CM | POA: Diagnosis not present

## 2024-05-19 DIAGNOSIS — Z79899 Other long term (current) drug therapy: Secondary | ICD-10-CM | POA: Diagnosis not present

## 2024-05-19 DIAGNOSIS — F1120 Opioid dependence, uncomplicated: Secondary | ICD-10-CM | POA: Diagnosis not present

## 2024-05-19 DIAGNOSIS — K5903 Drug induced constipation: Secondary | ICD-10-CM | POA: Diagnosis not present

## 2024-05-19 DIAGNOSIS — Z6837 Body mass index (BMI) 37.0-37.9, adult: Secondary | ICD-10-CM | POA: Diagnosis not present

## 2024-05-23 DIAGNOSIS — M6281 Muscle weakness (generalized): Secondary | ICD-10-CM | POA: Diagnosis not present

## 2024-05-26 DIAGNOSIS — M6281 Muscle weakness (generalized): Secondary | ICD-10-CM | POA: Diagnosis not present

## 2024-05-30 DIAGNOSIS — H6123 Impacted cerumen, bilateral: Secondary | ICD-10-CM | POA: Diagnosis not present

## 2024-05-30 DIAGNOSIS — Z23 Encounter for immunization: Secondary | ICD-10-CM | POA: Diagnosis not present

## 2024-05-30 DIAGNOSIS — E785 Hyperlipidemia, unspecified: Secondary | ICD-10-CM | POA: Diagnosis not present

## 2024-05-30 DIAGNOSIS — I10 Essential (primary) hypertension: Secondary | ICD-10-CM | POA: Diagnosis not present

## 2024-05-30 DIAGNOSIS — R7301 Impaired fasting glucose: Secondary | ICD-10-CM | POA: Diagnosis not present

## 2024-05-30 DIAGNOSIS — Z125 Encounter for screening for malignant neoplasm of prostate: Secondary | ICD-10-CM | POA: Diagnosis not present

## 2024-05-30 DIAGNOSIS — Z0000 Encounter for general adult medical examination without abnormal findings: Secondary | ICD-10-CM | POA: Diagnosis not present

## 2024-05-31 DIAGNOSIS — M6281 Muscle weakness (generalized): Secondary | ICD-10-CM | POA: Diagnosis not present

## 2024-06-02 DIAGNOSIS — J441 Chronic obstructive pulmonary disease with (acute) exacerbation: Secondary | ICD-10-CM | POA: Diagnosis not present

## 2024-06-02 DIAGNOSIS — M6281 Muscle weakness (generalized): Secondary | ICD-10-CM | POA: Diagnosis not present

## 2024-06-06 DIAGNOSIS — M6281 Muscle weakness (generalized): Secondary | ICD-10-CM | POA: Diagnosis not present

## 2024-06-07 DIAGNOSIS — J449 Chronic obstructive pulmonary disease, unspecified: Secondary | ICD-10-CM | POA: Diagnosis not present

## 2024-06-07 DIAGNOSIS — Z87891 Personal history of nicotine dependence: Secondary | ICD-10-CM | POA: Diagnosis not present

## 2024-06-07 DIAGNOSIS — R0902 Hypoxemia: Secondary | ICD-10-CM | POA: Diagnosis not present

## 2024-06-09 DIAGNOSIS — M6281 Muscle weakness (generalized): Secondary | ICD-10-CM | POA: Diagnosis not present

## 2024-06-13 DIAGNOSIS — M6281 Muscle weakness (generalized): Secondary | ICD-10-CM | POA: Diagnosis not present

## 2024-06-15 DIAGNOSIS — M6281 Muscle weakness (generalized): Secondary | ICD-10-CM | POA: Diagnosis not present

## 2024-06-16 DIAGNOSIS — F411 Generalized anxiety disorder: Secondary | ICD-10-CM | POA: Diagnosis not present

## 2024-06-16 DIAGNOSIS — K5903 Drug induced constipation: Secondary | ICD-10-CM | POA: Diagnosis not present

## 2024-06-16 DIAGNOSIS — F1120 Opioid dependence, uncomplicated: Secondary | ICD-10-CM | POA: Diagnosis not present

## 2024-06-18 DIAGNOSIS — G4733 Obstructive sleep apnea (adult) (pediatric): Secondary | ICD-10-CM | POA: Diagnosis not present

## 2024-06-20 DIAGNOSIS — M6281 Muscle weakness (generalized): Secondary | ICD-10-CM | POA: Diagnosis not present

## 2024-06-27 DIAGNOSIS — M6281 Muscle weakness (generalized): Secondary | ICD-10-CM | POA: Diagnosis not present

## 2024-06-29 DIAGNOSIS — M6281 Muscle weakness (generalized): Secondary | ICD-10-CM | POA: Diagnosis not present

## 2024-07-03 DIAGNOSIS — J441 Chronic obstructive pulmonary disease with (acute) exacerbation: Secondary | ICD-10-CM | POA: Diagnosis not present

## 2024-07-04 DIAGNOSIS — M6281 Muscle weakness (generalized): Secondary | ICD-10-CM | POA: Diagnosis not present

## 2024-07-05 DIAGNOSIS — R0902 Hypoxemia: Secondary | ICD-10-CM | POA: Diagnosis not present

## 2024-07-05 DIAGNOSIS — J90 Pleural effusion, not elsewhere classified: Secondary | ICD-10-CM | POA: Diagnosis not present

## 2024-07-05 DIAGNOSIS — J449 Chronic obstructive pulmonary disease, unspecified: Secondary | ICD-10-CM | POA: Diagnosis not present

## 2024-07-06 DIAGNOSIS — M6281 Muscle weakness (generalized): Secondary | ICD-10-CM | POA: Diagnosis not present

## 2024-07-10 DIAGNOSIS — F411 Generalized anxiety disorder: Secondary | ICD-10-CM | POA: Diagnosis not present

## 2024-07-10 DIAGNOSIS — F22 Delusional disorders: Secondary | ICD-10-CM | POA: Diagnosis not present

## 2024-07-10 DIAGNOSIS — R5383 Other fatigue: Secondary | ICD-10-CM | POA: Diagnosis not present

## 2024-07-11 DIAGNOSIS — L03116 Cellulitis of left lower limb: Secondary | ICD-10-CM | POA: Diagnosis not present

## 2024-07-11 DIAGNOSIS — W548XXA Other contact with dog, initial encounter: Secondary | ICD-10-CM | POA: Diagnosis not present

## 2024-07-11 DIAGNOSIS — S81802A Unspecified open wound, left lower leg, initial encounter: Secondary | ICD-10-CM | POA: Diagnosis not present

## 2024-07-13 DIAGNOSIS — R0602 Shortness of breath: Secondary | ICD-10-CM | POA: Diagnosis not present

## 2024-07-13 DIAGNOSIS — E669 Obesity, unspecified: Secondary | ICD-10-CM | POA: Diagnosis not present

## 2024-07-13 DIAGNOSIS — G4733 Obstructive sleep apnea (adult) (pediatric): Secondary | ICD-10-CM | POA: Diagnosis not present

## 2024-07-13 DIAGNOSIS — J90 Pleural effusion, not elsewhere classified: Secondary | ICD-10-CM | POA: Diagnosis not present

## 2024-07-14 DIAGNOSIS — Z79899 Other long term (current) drug therapy: Secondary | ICD-10-CM | POA: Diagnosis not present

## 2024-07-14 DIAGNOSIS — F411 Generalized anxiety disorder: Secondary | ICD-10-CM | POA: Diagnosis not present

## 2024-07-14 DIAGNOSIS — K5903 Drug induced constipation: Secondary | ICD-10-CM | POA: Diagnosis not present

## 2024-07-14 DIAGNOSIS — Z6839 Body mass index (BMI) 39.0-39.9, adult: Secondary | ICD-10-CM | POA: Diagnosis not present

## 2024-07-14 DIAGNOSIS — F1120 Opioid dependence, uncomplicated: Secondary | ICD-10-CM | POA: Diagnosis not present

## 2024-07-18 DIAGNOSIS — M6281 Muscle weakness (generalized): Secondary | ICD-10-CM | POA: Diagnosis not present

## 2024-07-20 DIAGNOSIS — M6281 Muscle weakness (generalized): Secondary | ICD-10-CM | POA: Diagnosis not present

## 2024-07-25 DIAGNOSIS — M6281 Muscle weakness (generalized): Secondary | ICD-10-CM | POA: Diagnosis not present

## 2024-08-01 DIAGNOSIS — M6281 Muscle weakness (generalized): Secondary | ICD-10-CM | POA: Diagnosis not present

## 2024-08-01 DIAGNOSIS — Z872 Personal history of diseases of the skin and subcutaneous tissue: Secondary | ICD-10-CM | POA: Diagnosis not present

## 2024-08-02 DIAGNOSIS — J441 Chronic obstructive pulmonary disease with (acute) exacerbation: Secondary | ICD-10-CM | POA: Diagnosis not present

## 2024-08-03 DIAGNOSIS — M6281 Muscle weakness (generalized): Secondary | ICD-10-CM | POA: Diagnosis not present

## 2024-08-06 DIAGNOSIS — G4733 Obstructive sleep apnea (adult) (pediatric): Secondary | ICD-10-CM | POA: Diagnosis not present

## 2024-08-08 DIAGNOSIS — M6281 Muscle weakness (generalized): Secondary | ICD-10-CM | POA: Diagnosis not present

## 2024-08-10 DIAGNOSIS — M6281 Muscle weakness (generalized): Secondary | ICD-10-CM | POA: Diagnosis not present

## 2024-08-11 DIAGNOSIS — F1120 Opioid dependence, uncomplicated: Secondary | ICD-10-CM | POA: Diagnosis not present

## 2024-08-11 DIAGNOSIS — F411 Generalized anxiety disorder: Secondary | ICD-10-CM | POA: Diagnosis not present

## 2024-08-11 DIAGNOSIS — K5903 Drug induced constipation: Secondary | ICD-10-CM | POA: Diagnosis not present

## 2024-08-15 DIAGNOSIS — M6281 Muscle weakness (generalized): Secondary | ICD-10-CM | POA: Diagnosis not present

## 2024-08-17 DIAGNOSIS — M6281 Muscle weakness (generalized): Secondary | ICD-10-CM | POA: Diagnosis not present
